# Patient Record
Sex: Male | Born: 1945 | Race: White | Hispanic: No | Smoking: Former smoker
Health system: Southern US, Community
[De-identification: ages and names within clinical notes are randomized; demographics above are authoritative.]

## PROBLEM LIST (undated history)

## (undated) DIAGNOSIS — I10 Essential (primary) hypertension: Secondary | ICD-10-CM

## (undated) DIAGNOSIS — C801 Malignant (primary) neoplasm, unspecified: Secondary | ICD-10-CM

## (undated) DIAGNOSIS — R51 Headache: Secondary | ICD-10-CM

## (undated) DIAGNOSIS — R519 Headache, unspecified: Secondary | ICD-10-CM

## (undated) DIAGNOSIS — K219 Gastro-esophageal reflux disease without esophagitis: Secondary | ICD-10-CM

## (undated) DIAGNOSIS — E039 Hypothyroidism, unspecified: Secondary | ICD-10-CM

## (undated) DIAGNOSIS — M1990 Unspecified osteoarthritis, unspecified site: Secondary | ICD-10-CM

---
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
## 2010-06-09 HISTORY — PX: EYE SURGERY: SHX253

## 2015-06-10 DIAGNOSIS — C801 Malignant (primary) neoplasm, unspecified: Secondary | ICD-10-CM

## 2015-06-10 HISTORY — DX: Malignant (primary) neoplasm, unspecified: C80.1

## 2017-12-24 NOTE — H&P (Signed)
TOTAL HIP ADMISSION H&P  Patient is admitted for right total hip arthroplasty, anterior approach.  Subjective:  Chief Complaint:   Right hip primary OA / pain  HPI: Calvin Yu, 72 y.o. male, has a history of pain and functional disability in the right hip due to arthritis and patient has failed non-surgical conservative treatments for greater than 12 weeks to include NSAID's and/or analgesics, use of assistive devices and activity modification.  Onset of symptoms was gradual starting 3+ years ago with gradually worsening course since that time.The patient noted no past surgery on the right hip(s).  Patient currently rates pain in the right hip at 9 out of 10 with activity. Patient has night pain, worsening of pain with activity and weight bearing, trendelenberg gait, pain that interfers with activities of daily living and pain with passive range of motion. Patient has evidence of periarticular osteophytes and joint space narrowing by imaging studies. This condition presents safety issues increasing the risk of falls.  There is no current active infection.  Risks, benefits and expectations were discussed with the patient.  Risks including but not limited to the risk of anesthesia, blood clots, nerve damage, blood vessel damage, failure of the prosthesis, infection and up to and including death.  Patient understand the risks, benefits and expectations and wishes to proceed with surgery.   PCP: Manfred Shirts, PA  D/C Plans:       Home   Post-op Meds:       No Rx given  Tranexamic Acid:      To be given - IV   Decadron:      Is to be given  FYI:      ASA  Norco  DME:   Pt already has equipment  PT:   No PT    Past Medical History:  Diagnosis Date  . Arthritis   . Cancer ([place]) 2017   Rectal  . GERD (gastroesophageal reflux disease)   . Headache   . Hypertension   . Hypothyroidism     Past Surgical History:  Procedure Laterality Date  . EYE SURGERY Bilateral 2012   Cataract   . THYROIDECTOMY  1963    No current facility-administered medications for this encounter.    Current Outpatient Medications  Medication Sig Dispense Refill Last Dose  . allopurinol (ZYLOPRIM) 300 MG tablet Take 300 mg by mouth daily.     . bimatoprost (LUMIGAN) 0.01 % SOLN Place 1 drop into both eyes at bedtime.     . brimonidine-timolol (COMBIGAN) 0.2-0.5 % ophthalmic solution Place 1 drop into both eyes every 12 (twelve) hours.     Marland Kitchen levothyroxine (SYNTHROID, LEVOTHROID) 175 MCG tablet Take 175 mcg by mouth daily before breakfast.     . MAGNESIUM PO Take 400 mg by mouth daily.     . pantoprazole (PROTONIX) 40 MG tablet Take 40 mg by mouth daily.     . potassium chloride SA (K-DUR,KLOR-CON) 20 MEQ tablet Take 20 mEq by mouth daily.     . tamsulosin (FLOMAX) 0.4 MG CAPS capsule Take 0.4 mg by mouth daily.      Allergies  Allergen Reactions  . Tomato Other (See Comments)    "extreme headaches"     Social History   Tobacco Use  . Smoking status: Former Smoker    Packs/day: 1.00    Years: 10.00    Pack years: 10.00    Types: Cigarettes    Last attempt to quit: 1972    Years  since quitting: 47.5  . Smokeless tobacco: Never Used  Substance Use Topics  . Alcohol use: Yes    Alcohol/week: 0.6 oz    Types: 1 Shots of liquor per week    Comment: daily       Review of Systems  Constitutional: Negative.   HENT: Negative.   Eyes: Negative.   Respiratory: Negative.   Cardiovascular: Negative.   Gastrointestinal: Positive for heartburn.       Colostomy  Genitourinary: Negative.   Musculoskeletal: Positive for joint pain.  Skin: Negative.   Neurological: Positive for headaches.  Endo/Heme/Allergies: Negative.   Psychiatric/Behavioral: Negative.     Objective:  Physical Exam  Constitutional: He is oriented to person, place, and time. He appears well-developed.  HENT:  Head: Normocephalic.  Eyes: Pupils are equal, round, and reactive to light.  Neck: Neck supple. No  JVD present. No tracheal deviation present. No thyromegaly present.  Cardiovascular: Normal rate, regular rhythm and intact distal pulses.  Respiratory: Effort normal and breath sounds normal. No respiratory distress. He has no wheezes.  GI: Soft. There is no tenderness. There is no guarding.  Musculoskeletal:       Right hip: He exhibits decreased range of motion, decreased strength, tenderness and bony tenderness. He exhibits no swelling, no deformity and no laceration.  Lymphadenopathy:    He has no cervical adenopathy.  Neurological: He is alert and oriented to person, place, and time.  Skin: Skin is warm and dry.  Psychiatric: He has a normal mood and affect.     Imaging Review Plain radiographs demonstrate severe degenerative joint disease of the right hip(s). The bone quality appears to be good for age and reported activity level.    Preoperative templating of the joint replacement has been completed, documented, and submitted to the Operating Room personnel in order to optimize intra-operative equipment management.     Assessment/Plan:  End stage arthritis, right hip  The patient history, physical examination, clinical judgement of the provider and imaging studies are consistent with end stage degenerative joint disease of the right hip and total hip arthroplasty is deemed medically necessary. The treatment options including medical management, injection therapy, arthroscopy and arthroplasty were discussed at length. The risks and benefits of total hip arthroplasty were presented and reviewed. The risks due to aseptic loosening, infection, stiffness, dislocation/subluxation,  thromboembolic complications and other imponderables were discussed.  The patient acknowledged the explanation, agreed to proceed with the plan and consent was signed. Patient is being admitted for inpatient treatment for surgery, pain control, PT, OT, prophylactic antibiotics, VTE prophylaxis, progressive  ambulation and ADL's and discharge planning.The patient is planning to be discharged home.     [place] Pugh Brocha Gilliam   PA-C  01/01/2018, 8:16 AM

## 2017-12-30 NOTE — Patient Instructions (Signed)
Calvin Yu  12/30/2017   Your procedure is scheduled on: 01-05-18   Report to [facility] Main  Entrance    Report to Admitting at 5:30 AM    Call this number if you have problems the morning of surgery [phone]   Remember: Do not eat food or drink liquids :After Midnight.     Take these medicines the morning of surgery with A SIP OF WATER:Allopurinol (Zyloprim), Levothyroxine (Snythroid), and Pantoprazole (Protonix) . You may bring and use your inhaler as needed                                You may not have any metal on your body including hair pins and              piercings  Do not wear jewelry, lotions, powders , cologne or deodorant             Men may shave face and neck.   Do not bring valuables to the hospital. [place].  Contacts, dentures or bridgework may not be worn into surgery.  Leave suitcase in the car. After surgery it may be brought to your room.    Special Instructions: N/A              Please read over the following fact sheets you were given: _____________________________________________________________________             [facility] - Preparing for Surgery Before surgery, you can play an important role.  Because skin is not sterile, your skin needs to be as free of germs as possible.  You can reduce the number of germs on your skin by washing with CHG (chlorahexidine gluconate) soap before surgery.  CHG is an antiseptic cleaner which kills germs and bonds with the skin to continue killing germs even after washing. Please DO NOT use if you have an allergy to CHG or antibacterial soaps.  If your skin becomes reddened/irritated stop using the CHG and inform your nurse when you arrive at Short Stay. Do not shave (including legs and underarms) for at least 48 hours prior to the first CHG shower.  You may shave your face/neck. Please follow these instructions carefully:  1.  Shower  with CHG Soap the night before surgery and the  morning of Surgery.  2.  If you choose to wash your hair, wash your hair first as usual with your  normal  shampoo.  3.  After you shampoo, rinse your hair and body thoroughly to remove the  shampoo.                           4.  Use CHG as you would any other liquid soap.  You can apply chg directly  to the skin and wash                       Gently with a scrungie or clean washcloth.  5.  Apply the CHG Soap to your body ONLY FROM THE NECK DOWN.   Do not use on face/ open  Wound or open sores. Avoid contact with eyes, ears mouth and genitals (private parts).                       Wash face,  Genitals (private parts) with your normal soap.             6.  Wash thoroughly, paying special attention to the area where your surgery  will be performed.  7.  Thoroughly rinse your body with warm water from the neck down.  8.  DO NOT shower/wash with your normal soap after using and rinsing off  the CHG Soap.                9.  Pat yourself dry with a clean towel.            10.  Wear clean pajamas.            11.  Place clean sheets on your bed the night of your first shower and do not  sleep with pets. Day of Surgery : Do not apply any lotions/deodorants the morning of surgery.  Please wear clean clothes to the hospital/surgery center.  FAILURE TO FOLLOW THESE INSTRUCTIONS MAY RESULT IN THE CANCELLATION OF YOUR SURGERY PATIENT SIGNATURE_________________________________  NURSE SIGNATURE__________________________________  ________________________________________________________________________   Calvin Yu  An incentive spirometer is a tool that can help keep your lungs clear and active. This tool measures how well you are filling your lungs with each breath. Taking long deep breaths may help reverse or decrease the chance of developing breathing (pulmonary) problems (especially infection) following:  A long period  of time when you are unable to move or be active. BEFORE THE PROCEDURE   If the spirometer includes an indicator to show your best effort, your nurse or respiratory therapist will set it to a desired goal.  If possible, sit up straight or lean slightly forward. Try not to slouch.  Hold the incentive spirometer in an upright position. INSTRUCTIONS FOR USE  1. Sit on the edge of your bed if possible, or sit up as far as you can in bed or on a chair. 2. Hold the incentive spirometer in an upright position. 3. Breathe out normally. 4. Place the mouthpiece in your mouth and seal your lips tightly around it. 5. Breathe in slowly and as deeply as possible, raising the piston or the ball toward the top of the column. 6. Hold your breath for 3-5 seconds or for as long as possible. Allow the piston or ball to fall to the bottom of the column. 7. Remove the mouthpiece from your mouth and breathe out normally. 8. Rest for a few seconds and repeat Steps 1 through 7 at least 10 times every 1-2 hours when you are awake. Take your time and take a few normal breaths between deep breaths. 9. The spirometer may include an indicator to show your best effort. Use the indicator as a goal to work toward during each repetition. 10. After each set of 10 deep breaths, practice coughing to be sure your lungs are clear. If you have an incision (the cut made at the time of surgery), support your incision when coughing by placing a pillow or rolled up towels firmly against it. Once you are able to get out of bed, walk around indoors and cough well. You may stop using the incentive spirometer when instructed by your caregiver.  RISKS AND COMPLICATIONS  Take your time so you do not get  dizzy or light-headed.  If you are in pain, you may need to take or ask for pain medication before doing incentive spirometry. It is harder to take a deep breath if you are having pain. AFTER USE  Rest and breathe slowly and easily.  It  can be helpful to keep track of a log of your progress. Your caregiver can provide you with a simple table to help with this. If you are using the spirometer at home, follow these instructions: [name] IF:   You are having difficultly using the spirometer.  You have trouble using the spirometer as often as instructed.  Your pain medication is not giving enough relief while using the spirometer.  You develop fever of 100.5 F (38.1 C) or higher. SEEK IMMEDIATE MEDICAL CARE IF:   You cough up bloody sputum that had not been present before.  You develop fever of 102 F (38.9 C) or greater.  You develop worsening pain at or near the incision site. MAKE SURE YOU:   Understand these instructions.  Will watch your condition.  Will get help right away if you are not doing well or get worse. Document Released: 10/06/2006 Document Revised: 08/18/2011 Document Reviewed: 12/07/2006 Exit[employer] Patient Information 2014 [employer], [place].   ________________________________________________________________________  WHAT IS A BLOOD TRANSFUSION? Blood Transfusion Information  A transfusion is the replacement of blood or some of its parts. Blood is made up of multiple cells which provide different functions.  Red blood cells carry oxygen and are used for blood loss replacement.  White blood cells fight against infection.  Platelets control bleeding.  Plasma helps clot blood.  Other blood products are available for specialized needs, such as hemophilia or other clotting disorders. BEFORE THE TRANSFUSION  Who gives blood for transfusions?   Healthy volunteers who are fully evaluated to make sure their blood is safe. This is blood bank blood. Transfusion therapy is the safest it has ever been in the practice of medicine. Before blood is taken from a donor, a complete history is taken to make sure that person has no history of diseases nor engages in risky social behavior (examples  are intravenous drug use or sexual activity with multiple partners). The donor's travel history is screened to minimize risk of transmitting infections, such as malaria. The donated blood is tested for signs of infectious diseases, such as HIV and hepatitis. The blood is then tested to be sure it is compatible with you in order to minimize the chance of a transfusion reaction. If you or a relative donates blood, this is often done in anticipation of surgery and is not appropriate for emergency situations. It takes many days to process the donated blood. RISKS AND COMPLICATIONS Although transfusion therapy is very safe and saves many lives, the main dangers of transfusion include:   Getting an infectious disease.  Developing a transfusion reaction. This is an allergic reaction to something in the blood you were given. Every precaution is taken to prevent this. The decision to have a blood transfusion has been considered carefully by your caregiver before blood is given. Blood is not given unless the benefits outweigh the risks. AFTER THE TRANSFUSION  Right after receiving a blood transfusion, you will usually feel much better and more energetic. This is especially true if your red blood cells have gotten low (anemic). The transfusion raises the level of the red blood cells which carry oxygen, and this usually causes an energy increase.  The nurse administering the transfusion will  monitor you carefully for complications. HOME CARE INSTRUCTIONS  No special instructions are needed after a transfusion. You may find your energy is better. Speak with your caregiver about any limitations on activity for underlying diseases you may have. SEEK MEDICAL CARE IF:   Your condition is not improving after your transfusion.  You develop redness or irritation at the intravenous (IV) site. SEEK IMMEDIATE MEDICAL CARE IF:  Any of the following symptoms occur over the next 12 hours:  Shaking chills.  You have a  temperature by mouth above 102 F (38.9 C), not controlled by medicine.  Chest, back, or muscle pain.  People around you feel you are not acting correctly or are confused.  Shortness of breath or difficulty breathing.  Dizziness and fainting.  You get a rash or develop hives.  You have a decrease in urine output.  Your urine turns a dark color or changes to pink, red, or brown. Any of the following symptoms occur over the next 10 days:  You have a temperature by mouth above 102 F (38.9 C), not controlled by medicine.  Shortness of breath.  Weakness after normal activity.  The white part of the eye turns yellow (jaundice).  You have a decrease in the amount of urine or are urinating less often.  Your urine turns a dark color or changes to pink, red, or brown. Document Released: 05/23/2000 Document Revised: 08/18/2011 Document Reviewed: 01/10/2008 [facility] Patient Information 2014 [place], [place].  _______________________________________________________________________

## 2017-12-31 ENCOUNTER — Encounter (HOSPITAL_COMMUNITY)
Admission: RE | Admit: 2017-12-31 | Discharge: 2017-12-31 | Disposition: A | Discharge status: Home / Self Care | Payer: Medicare Other | Source: Ambulatory Visit | Attending: Orthopedic Surgery | Admitting: Orthopedic Surgery

## 2017-12-31 ENCOUNTER — Other Ambulatory Visit: Payer: Self-pay

## 2017-12-31 ENCOUNTER — Encounter (HOSPITAL_COMMUNITY): Payer: Self-pay

## 2017-12-31 ENCOUNTER — Other Ambulatory Visit: Payer: Self-pay | Admitting: Orthopedic Surgery

## 2017-12-31 DIAGNOSIS — Z0183 Encounter for blood typing: Secondary | ICD-10-CM | POA: Diagnosis not present

## 2017-12-31 DIAGNOSIS — Z01818 Encounter for other preprocedural examination: Secondary | ICD-10-CM | POA: Diagnosis not present

## 2017-12-31 DIAGNOSIS — R9431 Abnormal electrocardiogram [ECG] [EKG]: Secondary | ICD-10-CM | POA: Insufficient documentation

## 2017-12-31 DIAGNOSIS — M1611 Unilateral primary osteoarthritis, right hip: Secondary | ICD-10-CM | POA: Diagnosis not present

## 2017-12-31 DIAGNOSIS — Z01812 Encounter for preprocedural laboratory examination: Secondary | ICD-10-CM | POA: Diagnosis not present

## 2017-12-31 DIAGNOSIS — I10 Essential (primary) hypertension: Secondary | ICD-10-CM | POA: Insufficient documentation

## 2017-12-31 HISTORY — DX: Malignant (primary) neoplasm, unspecified: C80.1

## 2017-12-31 HISTORY — DX: Unspecified osteoarthritis, unspecified site: M19.90

## 2017-12-31 HISTORY — DX: Essential (primary) hypertension: I10

## 2017-12-31 HISTORY — DX: Hypothyroidism, unspecified: E03.9

## 2017-12-31 HISTORY — DX: Headache, unspecified: R51.9

## 2017-12-31 HISTORY — DX: Headache: R51

## 2017-12-31 HISTORY — DX: Gastro-esophageal reflux disease without esophagitis: K21.9

## 2017-12-31 LAB — BASIC METABOLIC PANEL
ANION GAP: 13 (ref 5–15)
BUN: 11 mg/dL (ref 8–23)
CALCIUM: 9.3 mg/dL (ref 8.9–10.3)
CO2: 29 mmol/L (ref 22–32)
Chloride: 103 mmol/L (ref 98–111)
Creatinine, Ser: 0.63 mg/dL (ref 0.61–1.24)
GFR calc Af Amer: >60 mL/min (ref >60)
GLUCOSE: 89 mg/dL (ref 70–99)
Potassium: 3.3 mmol/L — ABNORMAL LOW (ref 3.5–5.1)
Sodium: 145 mmol/L (ref 135–145)

## 2017-12-31 LAB — CBC
HEMATOCRIT: 50.6 % (ref 39.0–52.0)
HEMOGLOBIN: 17.3 g/dL — AB (ref 13.0–17.0)
MCH: 35.2 pg — AB (ref 26.0–34.0)
MCHC: 34.2 g/dL (ref 30.0–36.0)
MCV: 102.8 fL — ABNORMAL HIGH (ref 78.0–100.0)
Platelets: 151 10*3/uL (ref 150–400)
RBC: 4.92 MIL/uL (ref 4.22–5.81)
RDW: 15 % (ref 11.5–15.5)
WBC: 7.4 10*3/uL (ref 4.0–10.5)

## 2017-12-31 LAB — SURGICAL PCR SCREEN
MRSA, PCR: NEGATIVE
STAPHYLOCOCCUS AUREUS: POSITIVE — AB

## 2017-12-31 LAB — ABO/RH: ABO/RH(D): O POS

## 2017-12-31 NOTE — Care Plan (Signed)
Ortho Bundle R THA scheduled on 01-05-18 DCP:  Home with brother.  1 story home with 2 ste.   DME:  No needs.   Borrowing a RW and toilets are elevated. PT:  HEP

## 2018-01-04 NOTE — Anesthesia Preprocedure Evaluation (Addendum)
Anesthesia Evaluation  Patient identified by MRN, date of birth, ID band Patient awake    Reviewed: Allergy & Precautions, H&P , NPO status , Patient's Chart, lab work & pertinent test results  Airway Mallampati: III  TM Distance: >3 FB Neck ROM: Full    Dental no notable dental hx. (+) Teeth Intact, Dental Advisory Given   Pulmonary neg pulmonary ROS, former smoker,    Pulmonary exam normal breath sounds clear to auscultation       Cardiovascular Exercise Tolerance: Good hypertension,  Rhythm:Regular Rate:Normal     Neuro/Psych  Headaches, negative psych ROS   GI/Hepatic Neg liver ROS, GERD  Medicated and Controlled,  Endo/Other  Hypothyroidism   Renal/GU negative Renal ROS  negative genitourinary   Musculoskeletal  (+) Arthritis , Osteoarthritis,    Abdominal   Peds  Hematology negative hematology ROS (+)   Anesthesia Other Findings   Reproductive/Obstetrics negative OB ROS                            Anesthesia Physical Anesthesia Plan  ASA: II  Anesthesia Plan: Spinal   Post-op Pain Management:    Induction: Intravenous  PONV Risk Score and Plan: 2 and Propofol infusion and Ondansetron  Airway Management Planned: Simple Face Mask  Additional Equipment:   Intra-op Plan:   Post-operative Plan:   Informed Consent: I have reviewed the patients History and Physical, chart, labs and discussed the procedure including the risks, benefits and alternatives for the proposed anesthesia with the patient or authorized representative who has indicated his/her understanding and acceptance.   Dental advisory given  Plan Discussed with: CRNA  Anesthesia Plan Comments:         Anesthesia Quick Evaluation

## 2018-01-05 ENCOUNTER — Encounter (HOSPITAL_COMMUNITY): Payer: Self-pay | Admitting: Emergency Medicine

## 2018-01-05 ENCOUNTER — Inpatient Hospital Stay (HOSPITAL_COMMUNITY): Payer: Medicare Other | Admitting: Anesthesiology

## 2018-01-05 ENCOUNTER — Inpatient Hospital Stay (HOSPITAL_COMMUNITY): Payer: Medicare Other

## 2018-01-05 ENCOUNTER — Encounter (HOSPITAL_COMMUNITY)
Admission: RE | Disposition: A | Discharge status: Home / Self Care | Payer: Self-pay | Source: Ambulatory Visit | Attending: Orthopedic Surgery

## 2018-01-05 ENCOUNTER — Other Ambulatory Visit: Payer: Self-pay

## 2018-01-05 ENCOUNTER — Inpatient Hospital Stay (HOSPITAL_COMMUNITY)
Admission: RE | Admit: 2018-01-05 | Discharge: 2018-01-06 | DRG: 470 | Disposition: A | Discharge status: Home / Self Care | Payer: Medicare Other | Attending: Orthopedic Surgery | Admitting: Orthopedic Surgery

## 2018-01-05 DIAGNOSIS — Z79890 Hormone replacement therapy (postmenopausal): Secondary | ICD-10-CM

## 2018-01-05 DIAGNOSIS — E890 Postprocedural hypothyroidism: Secondary | ICD-10-CM | POA: Diagnosis present

## 2018-01-05 DIAGNOSIS — Z96641 Presence of right artificial hip joint: Secondary | ICD-10-CM

## 2018-01-05 DIAGNOSIS — Z87891 Personal history of nicotine dependence: Secondary | ICD-10-CM | POA: Diagnosis not present

## 2018-01-05 DIAGNOSIS — E876 Hypokalemia: Secondary | ICD-10-CM

## 2018-01-05 DIAGNOSIS — Z96649 Presence of unspecified artificial hip joint: Secondary | ICD-10-CM

## 2018-01-05 DIAGNOSIS — M1611 Unilateral primary osteoarthritis, right hip: Principal | ICD-10-CM | POA: Diagnosis present

## 2018-01-05 DIAGNOSIS — K219 Gastro-esophageal reflux disease without esophagitis: Secondary | ICD-10-CM | POA: Diagnosis present

## 2018-01-05 DIAGNOSIS — I10 Essential (primary) hypertension: Secondary | ICD-10-CM | POA: Diagnosis present

## 2018-01-05 DIAGNOSIS — E663 Overweight: Secondary | ICD-10-CM | POA: Diagnosis present

## 2018-01-05 HISTORY — PX: TOTAL HIP ARTHROPLASTY: SHX124

## 2018-01-05 LAB — TYPE AND SCREEN
ABO/RH(D): O POS
ANTIBODY SCREEN: NEGATIVE

## 2018-01-05 SURGERY — ARTHROPLASTY, HIP, TOTAL, ANTERIOR APPROACH
Anesthesia: Spinal | Site: Hip | Laterality: Right

## 2018-01-05 MED ORDER — FENTANYL CITRATE (PF) 100 MCG/2ML IJ SOLN
INTRAMUSCULAR | Status: DC | PRN
  Administered 2018-01-05: 100 ug via INTRAVENOUS

## 2018-01-05 MED ORDER — MIDAZOLAM HCL 5 MG/5ML IJ SOLN
INTRAMUSCULAR | Status: DC | PRN
  Administered 2018-01-05: 2 mg via INTRAVENOUS

## 2018-01-05 MED ORDER — ALUM & MAG HYDROXIDE-SIMETH 200-200-20 MG/5ML PO SUSP: 15.0000 mL | ORAL | Status: DC | PRN

## 2018-01-05 MED ORDER — POLYETHYLENE GLYCOL 3350 17 G PO PACK: 17.0000 g | PACK | Freq: Two times a day (BID) | ORAL | 0 refills | Status: AC

## 2018-01-05 MED ORDER — METHOCARBAMOL 500 MG PO TABS
500.0000 mg | ORAL_TABLET | Freq: Four times a day (QID) | ORAL | Status: DC | PRN
  Administered 2018-01-05: 500 mg via ORAL
  Filled 2018-01-05: qty 1

## 2018-01-05 MED ORDER — CHLORHEXIDINE GLUCONATE 4 % EX LIQD: 60.0000 mL | Freq: Once | CUTANEOUS | Status: DC

## 2018-01-05 MED ORDER — PHENYLEPHRINE 40 MCG/ML (10ML) SYRINGE FOR IV PUSH (FOR BLOOD PRESSURE SUPPORT)
PREFILLED_SYRINGE | INTRAVENOUS | Status: DC | PRN
  Administered 2018-01-05: 40 ug via INTRAVENOUS

## 2018-01-05 MED ORDER — CHLORHEXIDINE GLUCONATE CLOTH 2 % EX PADS
6.0000 | MEDICATED_PAD | Freq: Every day | CUTANEOUS | Status: DC
  Administered 2018-01-06: 6 via TOPICAL

## 2018-01-05 MED ORDER — TRANEXAMIC ACID 1000 MG/10ML IV SOLN
1000.0000 mg | INTRAVENOUS | Status: AC
  Administered 2018-01-05: 1000 mg via INTRAVENOUS
  Filled 2018-01-05: qty 10

## 2018-01-05 MED ORDER — METOCLOPRAMIDE HCL 5 MG/ML IJ SOLN: 5.0000 mg | Freq: Three times a day (TID) | INTRAMUSCULAR | Status: DC | PRN

## 2018-01-05 MED ORDER — LIDOCAINE 2% (20 MG/ML) 5 ML SYRINGE
INTRAMUSCULAR | Status: AC
  Filled 2018-01-05: qty 5

## 2018-01-05 MED ORDER — LACTATED RINGERS IV SOLN
INTRAVENOUS | Status: DC | PRN
  Administered 2018-01-05 (×2): via INTRAVENOUS

## 2018-01-05 MED ORDER — EPHEDRINE SULFATE-NACL 50-0.9 MG/10ML-% IV SOSY
PREFILLED_SYRINGE | INTRAVENOUS | Status: DC | PRN
  Administered 2018-01-05 (×5): 10 mg via INTRAVENOUS

## 2018-01-05 MED ORDER — MIDAZOLAM HCL 2 MG/2ML IJ SOLN
INTRAMUSCULAR | Status: AC
  Filled 2018-01-05: qty 2

## 2018-01-05 MED ORDER — PROPOFOL 10 MG/ML IV BOLUS
INTRAVENOUS | Status: DC | PRN
  Administered 2018-01-05: 30 mg via INTRAVENOUS

## 2018-01-05 MED ORDER — 0.9 % SODIUM CHLORIDE (POUR BTL) OPTIME
TOPICAL | Status: DC | PRN
  Administered 2018-01-05: 1000 mL

## 2018-01-05 MED ORDER — DEXAMETHASONE SODIUM PHOSPHATE 10 MG/ML IJ SOLN
10.0000 mg | Freq: Once | INTRAMUSCULAR | Status: AC
  Administered 2018-01-06: 10 mg via INTRAVENOUS
  Filled 2018-01-05: qty 1

## 2018-01-05 MED ORDER — MORPHINE SULFATE (PF) 2 MG/ML IV SOLN: 0.5000 mg | INTRAVENOUS | Status: DC | PRN

## 2018-01-05 MED ORDER — POLYETHYLENE GLYCOL 3350 17 G PO PACK
17.0000 g | PACK | Freq: Two times a day (BID) | ORAL | Status: DC
  Administered 2018-01-05 – 2018-01-06 (×2): 17 g via ORAL
  Filled 2018-01-05 (×2): qty 1

## 2018-01-05 MED ORDER — LATANOPROST 0.005 % OP SOLN
1.0000 [drp] | Freq: Every day | OPHTHALMIC | Status: DC
  Administered 2018-01-05: 1 [drp] via OPHTHALMIC
  Filled 2018-01-05: qty 2.5

## 2018-01-05 MED ORDER — BUPIVACAINE IN DEXTROSE 0.75-8.25 % IT SOLN
INTRATHECAL | Status: DC | PRN
  Administered 2018-01-05: 2 mL via INTRATHECAL

## 2018-01-05 MED ORDER — DIPHENHYDRAMINE HCL 12.5 MG/5ML PO ELIX: 12.5000 mg | ORAL_SOLUTION | ORAL | Status: DC | PRN

## 2018-01-05 MED ORDER — METHOCARBAMOL 1000 MG/10ML IJ SOLN
500.0000 mg | Freq: Four times a day (QID) | INTRAVENOUS | Status: DC | PRN
  Administered 2018-01-05: 500 mg via INTRAVENOUS
  Filled 2018-01-05: qty 550

## 2018-01-05 MED ORDER — DOCUSATE SODIUM 100 MG PO CAPS: 100.0000 mg | ORAL_CAPSULE | Freq: Two times a day (BID) | ORAL | 0 refills | Status: AC

## 2018-01-05 MED ORDER — PANTOPRAZOLE SODIUM 40 MG PO TBEC
40.0000 mg | DELAYED_RELEASE_TABLET | Freq: Every day | ORAL | Status: DC
  Administered 2018-01-06: 40 mg via ORAL
  Filled 2018-01-05: qty 1

## 2018-01-05 MED ORDER — ONDANSETRON HCL 4 MG/2ML IJ SOLN: 4.0000 mg | Freq: Four times a day (QID) | INTRAMUSCULAR | Status: DC | PRN

## 2018-01-05 MED ORDER — TIMOLOL MALEATE 0.5 % OP SOLN
1.0000 [drp] | Freq: Two times a day (BID) | OPHTHALMIC | Status: DC
  Administered 2018-01-05 – 2018-01-06 (×2): 1 [drp] via OPHTHALMIC
  Filled 2018-01-05: qty 5

## 2018-01-05 MED ORDER — BRIMONIDINE TARTRATE 0.2 % OP SOLN
1.0000 [drp] | Freq: Two times a day (BID) | OPHTHALMIC | Status: DC
  Administered 2018-01-05 – 2018-01-06 (×2): 1 [drp] via OPHTHALMIC
  Filled 2018-01-05: qty 5

## 2018-01-05 MED ORDER — FENTANYL CITRATE (PF) 100 MCG/2ML IJ SOLN
INTRAMUSCULAR | Status: AC
  Filled 2018-01-05: qty 2

## 2018-01-05 MED ORDER — LIDOCAINE HCL (CARDIAC) PF 100 MG/5ML IV SOSY
PREFILLED_SYRINGE | INTRAVENOUS | Status: DC | PRN
  Administered 2018-01-05: 60 mg via INTRAVENOUS

## 2018-01-05 MED ORDER — ACETAMINOPHEN 325 MG PO TABS: 325.0000 mg | ORAL_TABLET | Freq: Four times a day (QID) | ORAL | Status: DC | PRN

## 2018-01-05 MED ORDER — FERROUS SULFATE 325 (65 FE) MG PO TABS
325.0000 mg | ORAL_TABLET | Freq: Three times a day (TID) | ORAL | Status: DC
  Administered 2018-01-06 (×2): 325 mg via ORAL
  Filled 2018-01-05 (×2): qty 1

## 2018-01-05 MED ORDER — PROPOFOL 10 MG/ML IV BOLUS
INTRAVENOUS | Status: AC
  Filled 2018-01-05: qty 60

## 2018-01-05 MED ORDER — PROPOFOL 500 MG/50ML IV EMUL
INTRAVENOUS | Status: DC | PRN
  Administered 2018-01-05: 100 ug/kg/min via INTRAVENOUS

## 2018-01-05 MED ORDER — PHENOL 1.4 % MT LIQD
1.0000 | OROMUCOSAL | Status: DC | PRN
  Filled 2018-01-05: qty 177

## 2018-01-05 MED ORDER — CEFAZOLIN SODIUM-DEXTROSE 2-4 GM/100ML-% IV SOLN
2.0000 g | Freq: Four times a day (QID) | INTRAVENOUS | Status: AC
  Administered 2018-01-05 (×2): 2 g via INTRAVENOUS
  Filled 2018-01-05 (×2): qty 100

## 2018-01-05 MED ORDER — BISACODYL 10 MG RE SUPP: 10.0000 mg | Freq: Every day | RECTAL | Status: DC | PRN

## 2018-01-05 MED ORDER — HYDROCODONE-ACETAMINOPHEN 7.5-325 MG PO TABS: 1.0000 | ORAL_TABLET | ORAL | 0 refills | Status: AC | PRN

## 2018-01-05 MED ORDER — EPHEDRINE 5 MG/ML INJ
INTRAVENOUS | Status: AC
  Filled 2018-01-05: qty 10

## 2018-01-05 MED ORDER — FERROUS SULFATE 325 (65 FE) MG PO TABS: 325.0000 mg | ORAL_TABLET | Freq: Three times a day (TID) | ORAL | 3 refills | Status: AC

## 2018-01-05 MED ORDER — TAMSULOSIN HCL 0.4 MG PO CAPS
0.4000 mg | ORAL_CAPSULE | Freq: Every day | ORAL | Status: DC
  Administered 2018-01-05 – 2018-01-06 (×2): 0.4 mg via ORAL
  Filled 2018-01-05 (×2): qty 1

## 2018-01-05 MED ORDER — CELECOXIB 200 MG PO CAPS
200.0000 mg | ORAL_CAPSULE | Freq: Two times a day (BID) | ORAL | Status: DC
  Administered 2018-01-05 – 2018-01-06 (×2): 200 mg via ORAL
  Filled 2018-01-05 (×2): qty 1

## 2018-01-05 MED ORDER — METOCLOPRAMIDE HCL 5 MG PO TABS: 5.0000 mg | ORAL_TABLET | Freq: Three times a day (TID) | ORAL | Status: DC | PRN

## 2018-01-05 MED ORDER — DEXAMETHASONE SODIUM PHOSPHATE 10 MG/ML IJ SOLN
10.0000 mg | Freq: Once | INTRAMUSCULAR | Status: AC
  Administered 2018-01-05: 10 mg via INTRAVENOUS

## 2018-01-05 MED ORDER — METHOCARBAMOL 500 MG IVPB - SIMPLE MED
INTRAVENOUS | Status: AC
  Filled 2018-01-05: qty 50

## 2018-01-05 MED ORDER — LEVOTHYROXINE SODIUM 50 MCG PO TABS
175.0000 ug | ORAL_TABLET | Freq: Every day | ORAL | Status: DC
  Administered 2018-01-06: 175 ug via ORAL
  Filled 2018-01-05: qty 1

## 2018-01-05 MED ORDER — HYDROCODONE-ACETAMINOPHEN 5-325 MG PO TABS
1.0000 | ORAL_TABLET | ORAL | Status: DC | PRN
  Administered 2018-01-05 – 2018-01-06 (×2): 2 via ORAL
  Administered 2018-01-06: 1 via ORAL
  Administered 2018-01-06: 2 via ORAL
  Filled 2018-01-05 (×4): qty 2

## 2018-01-05 MED ORDER — DEXAMETHASONE SODIUM PHOSPHATE 10 MG/ML IJ SOLN
INTRAMUSCULAR | Status: AC
  Filled 2018-01-05: qty 1

## 2018-01-05 MED ORDER — ONDANSETRON HCL 4 MG PO TABS: 4.0000 mg | ORAL_TABLET | Freq: Four times a day (QID) | ORAL | Status: DC | PRN

## 2018-01-05 MED ORDER — TRANEXAMIC ACID 1000 MG/10ML IV SOLN
1000.0000 mg | Freq: Once | INTRAVENOUS | Status: AC
  Administered 2018-01-05: 1000 mg via INTRAVENOUS
  Filled 2018-01-05: qty 10

## 2018-01-05 MED ORDER — ASPIRIN 81 MG PO CHEW
81.0000 mg | CHEWABLE_TABLET | Freq: Two times a day (BID) | ORAL | Status: DC
  Administered 2018-01-05 – 2018-01-06 (×2): 81 mg via ORAL
  Filled 2018-01-05 (×2): qty 1

## 2018-01-05 MED ORDER — MUPIROCIN 2 % EX OINT
1.0000 "application " | TOPICAL_OINTMENT | Freq: Two times a day (BID) | CUTANEOUS | Status: DC
  Administered 2018-01-05 – 2018-01-06 (×2): 1 via NASAL
  Filled 2018-01-05: qty 22

## 2018-01-05 MED ORDER — HYDROCODONE-ACETAMINOPHEN 7.5-325 MG PO TABS
1.0000 | ORAL_TABLET | ORAL | Status: DC | PRN
  Administered 2018-01-05: 1 via ORAL
  Filled 2018-01-05: qty 1

## 2018-01-05 MED ORDER — CEFAZOLIN SODIUM-DEXTROSE 2-4 GM/100ML-% IV SOLN
2.0000 g | INTRAVENOUS | Status: AC
  Administered 2018-01-05: 2 g via INTRAVENOUS
  Filled 2018-01-05: qty 100

## 2018-01-05 MED ORDER — ONDANSETRON HCL 4 MG/2ML IJ SOLN
INTRAMUSCULAR | Status: AC
  Filled 2018-01-05: qty 2

## 2018-01-05 MED ORDER — MENTHOL 3 MG MT LOZG: 1.0000 | LOZENGE | OROMUCOSAL | Status: DC | PRN

## 2018-01-05 MED ORDER — ASPIRIN 81 MG PO CHEW: 81.0000 mg | CHEWABLE_TABLET | Freq: Two times a day (BID) | ORAL | 0 refills | Status: AC

## 2018-01-05 MED ORDER — MAGNESIUM CITRATE PO SOLN: 1.0000 | Freq: Once | ORAL | Status: DC | PRN

## 2018-01-05 MED ORDER — STERILE WATER FOR IRRIGATION IR SOLN
Status: DC | PRN
  Administered 2018-01-05: 2000 mL

## 2018-01-05 MED ORDER — DOCUSATE SODIUM 100 MG PO CAPS
100.0000 mg | ORAL_CAPSULE | Freq: Two times a day (BID) | ORAL | Status: DC
  Administered 2018-01-05 – 2018-01-06 (×2): 100 mg via ORAL
  Filled 2018-01-05 (×2): qty 1

## 2018-01-05 MED ORDER — BRIMONIDINE TARTRATE-TIMOLOL 0.2-0.5 % OP SOLN
1.0000 [drp] | Freq: Two times a day (BID) | OPHTHALMIC | Status: DC
  Filled 2018-01-05: qty 5

## 2018-01-05 MED ORDER — ONDANSETRON HCL 4 MG/2ML IJ SOLN
INTRAMUSCULAR | Status: DC | PRN
  Administered 2018-01-05: 4 mg via INTRAVENOUS

## 2018-01-05 MED ORDER — METHOCARBAMOL 500 MG PO TABS: 500.0000 mg | ORAL_TABLET | Freq: Four times a day (QID) | ORAL | 0 refills | Status: AC | PRN

## 2018-01-05 MED ORDER — SODIUM CHLORIDE 0.9 % IV SOLN
INTRAVENOUS | Status: DC
  Administered 2018-01-05 (×2): via INTRAVENOUS

## 2018-01-05 MED ORDER — ALLOPURINOL 300 MG PO TABS
300.0000 mg | ORAL_TABLET | Freq: Every day | ORAL | Status: DC
  Administered 2018-01-06: 300 mg via ORAL
  Filled 2018-01-05: qty 1

## 2018-01-05 SURGICAL SUPPLY — 35 items
BAG DECANTER FOR FLEXI CONT (MISCELLANEOUS) IMPLANT
BAG ZIPLOCK 12X15 (MISCELLANEOUS) IMPLANT
BLADE SAG 18X100X1.27 (BLADE) ×3 IMPLANT
CAPT HIP TOTAL 2 ×3 IMPLANT
COVER PERINEAL POST (MISCELLANEOUS) ×3 IMPLANT
COVER SURGICAL LIGHT HANDLE (MISCELLANEOUS) ×3 IMPLANT
DERMABOND ADVANCED (GAUZE/BANDAGES/DRESSINGS) ×2
DERMABOND ADVANCED .7 DNX12 (GAUZE/BANDAGES/DRESSINGS) ×1 IMPLANT
DRAPE STERI IOBAN 125X83 (DRAPES) ×3 IMPLANT
DRAPE U-SHAPE 47X51 STRL (DRAPES) ×6 IMPLANT
DRESSING AQUACEL AG SP 3.5X10 (GAUZE/BANDAGES/DRESSINGS) ×1 IMPLANT
DRSG AQUACEL AG SP 3.5X10 (GAUZE/BANDAGES/DRESSINGS) ×3
DURAPREP 26ML APPLICATOR (WOUND CARE) ×3 IMPLANT
ELECT REM PT RETURN 15FT ADLT (MISCELLANEOUS) ×3 IMPLANT
GLOVE BIOGEL M STRL SZ7.5 (GLOVE) IMPLANT
GLOVE BIOGEL PI IND STRL 7.5 (GLOVE) ×1 IMPLANT
GLOVE BIOGEL PI IND STRL 8.5 (GLOVE) ×1 IMPLANT
GLOVE BIOGEL PI INDICATOR 7.5 (GLOVE) ×2
GLOVE BIOGEL PI INDICATOR 8.5 (GLOVE) ×2
GLOVE ECLIPSE 8.0 STRL XLNG CF (GLOVE) ×6 IMPLANT
GLOVE ORTHO TXT STRL SZ7.5 (GLOVE) ×3 IMPLANT
GLOVE SURG SS PI 6.5 STRL IVOR (GLOVE) ×6 IMPLANT
GOWN STRL REUS W/TWL 2XL LVL3 (GOWN DISPOSABLE) ×3 IMPLANT
GOWN STRL REUS W/TWL LRG LVL3 (GOWN DISPOSABLE) ×3 IMPLANT
HOLDER FOLEY CATH W/STRAP (MISCELLANEOUS) ×3 IMPLANT
PACK ANTERIOR HIP CUSTOM (KITS) ×3 IMPLANT
SUT MNCRL AB 4-0 PS2 18 (SUTURE) ×3 IMPLANT
SUT STRATAFIX 0 PDS 27 VIOLET (SUTURE) ×3
SUT VIC AB 1 CT1 36 (SUTURE) ×9 IMPLANT
SUT VIC AB 2-0 CT1 27 (SUTURE) ×4
SUT VIC AB 2-0 CT1 TAPERPNT 27 (SUTURE) ×2 IMPLANT
SUTURE STRATFX 0 PDS 27 VIOLET (SUTURE) ×1 IMPLANT
TRAY FOLEY MTR SLVR 16FR STAT (SET/KITS/TRAYS/PACK) IMPLANT
WATER STERILE IRR 1000ML POUR (IV SOLUTION) ×3 IMPLANT
YANKAUER SUCT BULB TIP 10FT TU (MISCELLANEOUS) IMPLANT

## 2018-01-05 NOTE — Op Note (Signed)
NAME:  Calvin Yu                ACCOUNT NO.: [id number]      MEDICAL RECORD NO.: [id number]      FACILITY:  [facility]      PHYSICIAN:  Mauri Pole  DATE OF BIRTH:  [birth year]     DATE OF PROCEDURE:  01/05/2018                                 OPERATIVE REPORT         PREOPERATIVE DIAGNOSIS: Right  hip osteoarthritis.      POSTOPERATIVE DIAGNOSIS:  Right hip osteoarthritis.      PROCEDURE:  Right total hip replacement through an anterior approach   utilizing DePuy THR system, component size 44mm pinnacle cup, a size 36+4 neutral   Altrex liner, a size 7 Hi Tri Lock stem with a 36+1.5 delta ceramic   ball.      SURGEON:  Pietro Cassis. Alvan Dame, M.D.      ASSISTANT:  Danae Orleans, PA-C     ANESTHESIA:  Spinal.      SPECIMENS:  None.      COMPLICATIONS:  None.      BLOOD LOSS:  150 cc     DRAINS:  None.      INDICATION OF THE PROCEDURE:  Calvin Yu is a 72 y.o. male who had   presented to office for evaluation of right hip pain.  Radiographs revealed   progressive degenerative changes with bone-on-bone   articulation of the  hip joint, including subchondral cystic changes and osteophytes.  The patient had painful limited range of   motion significantly affecting their overall quality of life and function.  The patient was failing to    respond to conservative measures including medications and/or injections and activity modification and at this point was ready   to proceed with more definitive measures.  Consent was obtained for   benefit of pain relief.  Specific risks of infection, DVT, component   failure, dislocation, neurovascular injury, and need for revision surgery were reviewed in the office as well discussion of   the anterior versus posterior approach were reviewed.     PROCEDURE IN DETAIL:  The patient was brought to operative theater.   Once adequate anesthesia, preoperative antibiotics, 2 gm of Ancef, 1 gm of Tranexamic Acid, and 10  mg of Decadron were administered, the patient was positioned supine on the [employer] table.  Once the patient was safely positioned with adequate padding of boney prominences we predraped out the hip, and used fluoroscopy to confirm orientation of the pelvis.      The right hip was then prepped and draped from proximal iliac crest to   mid thigh with a shower curtain technique.      Time-out was performed identifying the patient, planned procedure, and the appropriate extremity.     An incision was then made 2 cm lateral to the   anterior superior iliac spine extending over the orientation of the   tensor fascia lata muscle and sharp dissection was carried down to the   fascia of the muscle.      The fascia was then incised.  The muscle belly was identified and swept   laterally and retractor placed along the superior neck.  Following   cauterization of the circumflex vessels and removing some  pericapsular   fat, a second cobra retractor was placed on the inferior neck.  A T-capsulotomy was made along the line of the   superior neck to the trochanteric fossa, then extended proximally and   distally.  Tag sutures were placed and the retractors were then placed   intracapsular.  We then identified the trochanteric fossa and   orientation of my neck cut and then made a neck osteotomy with the femur on traction.  The femoral   head was removed without difficulty or complication.  Traction was let   off and retractors were placed posterior and anterior around the   acetabulum.      The labrum and foveal tissue were debrided.  I began reaming with a 46 mm   reamer and reamed up to 51 mm reamer with good bony bed preparation and a 52 mm  cup was chosen.  The final 52 mm Pinnacle cup was then impacted under fluoroscopy to confirm the depth of penetration and orientation with respect to   Abduction and forward flexion.  A screw was placed into the ilium followed by the hole eliminator.  The final    36+4 neutral Altrex liner was impacted with good visualized rim fit.  The cup was positioned anatomically within the acetabular portion of the pelvis.      At this point, the femur was rolled to 100 degrees.  Further capsule was   released off the inferior aspect of the femoral neck.  I then   released the superior capsule proximally.  With the leg in a neutral position the hook was placed laterally   along the femur under the vastus lateralis origin and elevated manually and then held in position using the hook attachment on the bed.  The leg was then extended and adducted with the leg rolled to 100   degrees of external rotation.  Retractors were placed along the medial calcar and posteriorly over the greater trochanter.  Once the proximal femur was fully   exposed, I used a box osteotome to set orientation.  I then began   broaching with the starting chili pepper broach and passed this by hand and then broached up to 7.  With the 7 broach in place I chose a high offset neck and did several trial reductions.  The offset was appropriate, leg lengths   appeared to be equal best matched with the +1.5 head ball trial confirmed radiographically.   Given these findings, I went ahead and dislocated the hip, repositioned all   retractors and positioned the right hip in the extended and abducted position.  The final 7 Hi Tri Lock stem was   chosen and it was impacted down to the level of neck cut.  Based on this   and the trial reductions, a final 36+1.5 delta ceramic ball was chosen and   impacted onto a clean and dry trunnion, and the hip was reduced.  The   hip had been irrigated throughout the case again at this point.  I did   reapproximate the superior capsular leaflet to the anterior leaflet   using #1 Vicryl.  The fascia of the   tensor fascia lata muscle was then reapproximated using #1 Vicryl and #0 Stratafix sutures.  The   remaining wound was closed with 2-0 Vicryl and running 4-0  Monocryl.   The hip was cleaned, dried, and dressed sterilely using Dermabond and   Aquacel dressing.  The patient was then brought  to recovery room in stable condition tolerating the procedure well.    Danae Orleans, PA-C was present for the entirety of the case involved from   preoperative positioning, perioperative retractor management, general   facilitation of the case, as well as primary wound closure as assistant.            Pietro Cassis Alvan Dame, M.D.        01/05/2018 8:38 AM

## 2018-01-05 NOTE — Progress Notes (Signed)
Care Plan Notes 10/07/2017 to 01/05/2018       Care Plan by Leonides Grills A at 12/31/2017  3:44 PM    Date of Service   Author Author Type Status Note Type File Time  12/31/2017  Lauer, [place] Plan 12/31/2017             Ortho Bundle R THA scheduled on 01-05-18 DCP:  Home with brother.  1 story home with 2 ste.   DME:  No needs.   Borrowing a RW and toilets are elevated. PT:  HEP

## 2018-01-05 NOTE — Discharge Instructions (Signed)

## 2018-01-05 NOTE — Evaluation (Signed)
Physical Therapy Evaluation Patient Details Name: Calvin Yu MRN: [id number] DOB: 04-Dec-1945 Today's Date: 01/05/2018   History of Present Illness  72 yo male s/p R THA-direct anterior 01/05/18.   Clinical Impression  On eval POD 0, pt required Min assist for mobility. He walked ~75 feet with a RW. Minimal pain with activity. Will progress activity as tolerated. Plan is for d/c home with HEP.     Follow Up Recommendations Follow surgeon's recommendation for DC plan and follow-up therapies    Equipment Recommendations  None recommended by PT    Recommendations for Other Services       Precautions / Restrictions Precautions Precautions: Fall Restrictions Weight Bearing Restrictions: No Other Position/Activity Restrictions: WBAT      Mobility  Bed Mobility Overal bed mobility: Needs Assistance Bed Mobility: Supine to Sit     Supine to sit: Min assist;HOB elevated     General bed mobility comments: Assist for R LE.   Transfers Overall transfer level: Needs assistance Equipment used: Rolling walker (2 wheeled) Transfers: Sit to/from Stand Sit to Stand: Min assist         General transfer comment: VCs safety, technique, hand placement. Assist to steady.   Ambulation/Gait Ambulation/Gait assistance: Min assist Gait Distance (Feet): 75 Feet Assistive device: Rolling walker (2 wheeled) Gait Pattern/deviations: Step-to pattern;Step-through pattern;Decreased stride length     General Gait Details: VCs safety, sequence. Assist to steady.   Stairs            Wheelchair Mobility    Modified Rankin (Stroke Patients Only)       Balance Overall balance assessment: Needs assistance         Standing balance support: Bilateral upper extremity supported Standing balance-Leahy Scale: Poor                               Pertinent Vitals/Pain Pain Assessment: 0-10 Pain Score: 5  Pain Location: R hip with activity Pain Descriptors /  Indicators: Sore Pain Intervention(s): Monitored during session    Home Living Family/patient expects to be discharged to:: Private residence Living Arrangements: Other relatives(will d/c to brother's house) Available Help at Discharge: Family Type of Home: House Home Access: Stairs to enter Entrance Stairs-Rails: None Technical brewer of Steps: 2 Home Layout: One level Home Equipment: Environmental consultant - 2 wheels      Prior Function Level of Independence: Independent with assistive device(s)         Comments: using cane prior to surgery     Hand Dominance        Extremity/Trunk Assessment   Upper Extremity Assessment Upper Extremity Assessment: Overall WFL for tasks assessed    Lower Extremity Assessment Lower Extremity Assessment: Generalized weakness(s/p R THA)    Cervical / Trunk Assessment Cervical / Trunk Assessment: Normal  Communication   Communication: No difficulties  Cognition Arousal/Alertness: Awake/alert Behavior During Therapy: WFL for tasks assessed/performed Overall Cognitive Status: Within Functional Limits for tasks assessed                                        General Comments      Exercises     Assessment/Plan    PT Assessment Patient needs continued PT services  PT Problem List Decreased strength;Decreased balance;Decreased mobility;Decreased range of motion;Decreased activity tolerance;Pain;Decreased knowledge of use of DME  PT Treatment Interventions DME instruction;Gait training;Functional mobility training;Balance training;Patient/family education;Therapeutic exercise;Therapeutic activities;Stair training    PT Goals (Current goals can be found in the Care Plan section)  Acute Rehab PT Goals Patient Stated Goal: home. regain PLOF/independence PT Goal Formulation: With patient Time For Goal Achievement: 01/19/18 Potential to Achieve Goals: Good    Frequency 7X/week   Barriers to discharge         Co-evaluation               AM-PAC PT "6 Clicks" Daily Activity  Outcome Measure Difficulty turning over in bed (including adjusting bedclothes, sheets and blankets)?: A Lot Difficulty moving from lying on back to sitting on the side of the bed? : Unable Difficulty sitting down on and standing up from a chair with arms (e.g., wheelchair, bedside commode, etc,.)?: Unable Help needed moving to and from a bed to chair (including a wheelchair)?: A Little Help needed walking in hospital room?: A Little Help needed climbing 3-5 steps with a railing? : A Little 6 Click Score: 13    End of Session Equipment Utilized During Treatment: Gait belt Activity Tolerance: Patient tolerated treatment well Patient left: in bed;with call bell/phone within reach   PT Visit Diagnosis: Pain;Other abnormalities of gait and mobility (R26.89);Difficulty in walking, not elsewhere classified (R26.2) Pain - Right/Left: Right Pain - part of body: Hip    Time: 5809-9833 PT Time Calculation (min) (ACUTE ONLY): 17 min   Charges:   PT Evaluation $PT Eval Low Complexity: 1 Low            Weston Anna, MPT Pager: [phone]

## 2018-01-05 NOTE — Anesthesia Procedure Notes (Signed)
Spinal  Patient location during procedure: OR End time: 01/05/2018 7:21 AM Staffing Resident/CRNA: Caryl Pina T, CRNA Performed: resident/CRNA  Preanesthetic Checklist Completed: patient identified, site marked, surgical consent, pre-op evaluation, timeout performed, IV checked, risks and benefits discussed and monitors and equipment checked Spinal Block Patient position: sitting Prep: ChloraPrep Patient monitoring: heart rate, cardiac monitor, continuous pulse ox and blood pressure Approach: midline Location: L3-4 Injection technique: single-shot Needle Needle type: Pencan  Needle gauge: 24 G Needle length: 9 cm Assessment Sensory level: T6 Additional Notes Expiration date of kit checked and confirmed. Patient tolerated procedure well, without complications.

## 2018-01-05 NOTE — Interval H&P Note (Signed)
History and Physical Interval Note:  01/05/2018 6:12 AM  Unknown Foley  has presented today for surgery, with the diagnosis of Right hip osteoarthritis  The various methods of treatment have been discussed with the patient and family. After consideration of risks, benefits and other options for treatment, the patient has consented to  Procedure(s) with comments: RIGHT TOTAL HIP ARTHROPLASTY ANTERIOR APPROACH (Right) - 70 mins as a surgical intervention .  The patient's history has been reviewed, patient examined, no change in status, stable for surgery.  I have reviewed the patient's chart and labs.  Questions were answered to the patient's satisfaction.     Mauri Pole

## 2018-01-05 NOTE — Anesthesia Postprocedure Evaluation (Signed)
Anesthesia Post Note  Patient: Calvin Yu  Procedure(s) Performed: RIGHT TOTAL HIP ARTHROPLASTY ANTERIOR APPROACH (Right Hip)     Patient location during evaluation: PACU Anesthesia Type: Spinal Level of consciousness: oriented and awake and alert Pain management: pain level controlled Vital Signs Assessment: post-procedure vital signs reviewed and stable Respiratory status: spontaneous breathing, respiratory function stable and patient connected to nasal cannula oxygen Cardiovascular status: blood pressure returned to baseline and stable Postop Assessment: no headache, no backache, no apparent nausea or vomiting, spinal receding and patient able to bend at knees Anesthetic complications: no    Last Vitals:  Vitals:   01/05/18 1128 01/05/18 1129  BP:  (!) 147/87  Pulse: 60 67  Resp: 14 16  Temp: (!) 36.3 C (!) 36.3 C  SpO2:  100%    Last Pain:  Vitals:   01/05/18 1129  TempSrc: Oral  PainSc:                  Jeanice Dempsey,W. EDMOND

## 2018-01-05 NOTE — Transfer of Care (Signed)
Immediate Anesthesia Transfer of Care Note  Patient: Calvin Yu  Procedure(s) Performed: RIGHT TOTAL HIP ARTHROPLASTY ANTERIOR APPROACH (Right Hip)  Patient Location: PACU  Anesthesia Type:Spinal  Level of Consciousness: oriented, drowsy and patient cooperative  Airway & Oxygen Therapy: Patient Spontanous Breathing and Patient connected to face mask oxygen  Post-op Assessment: Report given to RN, Post -op Vital signs reviewed and stable and Patient moving all extremities  Post vital signs: Reviewed and stable  Last Vitals:  Vitals Value Taken Time  BP 123/66 01/05/2018  9:00 AM  Temp    Pulse 64 01/05/2018  9:06 AM  Resp 19 01/05/2018  9:06 AM  SpO2 100 % 01/05/2018  9:06 AM  Vitals shown include unvalidated device data.  Last Pain:  Vitals:   01/05/18 0544  TempSrc:   PainSc: 0-No pain      Patients Stated Pain Goal: 4 (65/03/54 6568)  Complications: No apparent anesthesia complications

## 2018-01-06 ENCOUNTER — Encounter (HOSPITAL_COMMUNITY): Payer: Self-pay | Admitting: Orthopedic Surgery

## 2018-01-06 DIAGNOSIS — E663 Overweight: Secondary | ICD-10-CM | POA: Diagnosis present

## 2018-01-06 DIAGNOSIS — E876 Hypokalemia: Secondary | ICD-10-CM

## 2018-01-06 LAB — CBC
HEMATOCRIT: 43.5 % (ref 39.0–52.0)
Hemoglobin: 15.3 g/dL (ref 13.0–17.0)
MCH: 35.8 pg — ABNORMAL HIGH (ref 26.0–34.0)
MCHC: 35.2 g/dL (ref 30.0–36.0)
MCV: 101.9 fL — ABNORMAL HIGH (ref 78.0–100.0)
Platelets: 115 10*3/uL — ABNORMAL LOW (ref 150–400)
RBC: 4.27 MIL/uL (ref 4.22–5.81)
RDW: 14.1 % (ref 11.5–15.5)
WBC: 11.8 10*3/uL — AB (ref 4.0–10.5)

## 2018-01-06 LAB — BASIC METABOLIC PANEL
Anion gap: 9 (ref 5–15)
BUN: 13 mg/dL (ref 8–23)
CALCIUM: 8.3 mg/dL — AB (ref 8.9–10.3)
CO2: 29 mmol/L (ref 22–32)
Chloride: 103 mmol/L (ref 98–111)
Creatinine, Ser: 0.61 mg/dL (ref 0.61–1.24)
GFR calc Af Amer: >60 mL/min (ref >60)
Glucose, Bld: 126 mg/dL — ABNORMAL HIGH (ref 70–99)
Potassium: 3 mmol/L — ABNORMAL LOW (ref 3.5–5.1)
Sodium: 141 mmol/L (ref 135–145)

## 2018-01-06 MED ORDER — POTASSIUM CHLORIDE CRYS ER 20 MEQ PO TBCR
40.0000 meq | EXTENDED_RELEASE_TABLET | Freq: Once | ORAL | Status: AC
  Administered 2018-01-06: 40 meq via ORAL
  Filled 2018-01-06: qty 2

## 2018-01-06 NOTE — Progress Notes (Signed)
Physical Therapy Treatment Patient Details Name: Calvin Yu MRN: [id number] DOB: 1946-06-08 Today's Date: 01/06/2018    History of Present Illness 72 yo male s/p R THA-direct anterior 01/05/18.     PT Comments    Progressing with mobility. Reviewed/practiced exercises, gait training, and stair training. Issued HEP for pt to perform 2x/day. All education completed. Okay to d/c from PT standpoint-made RN aware.    Follow Up Recommendations  Follow surgeon's recommendation for DC plan and follow-up therapies     Equipment Recommendations  None recommended by PT    Recommendations for Other Services       Precautions / Restrictions Precautions Precautions: Fall Restrictions Weight Bearing Restrictions: No Other Position/Activity Restrictions: WBAT    Mobility  Bed Mobility               General bed mobility comments: oob in recliner  Transfers Overall transfer level: Needs assistance Equipment used: Rolling walker (2 wheeled) Transfers: Sit to/from Stand Sit to Stand: Min guard         General transfer comment: close guard for safety. VCS safety hand placement  Ambulation/Gait Ambulation/Gait assistance: Min guard Gait Distance (Feet): 150 Feet Assistive device: Rolling walker (2 wheeled) Gait Pattern/deviations: Step-through pattern;Decreased stride length     General Gait Details: close guard for safety.    Stairs Stairs: Yes Min Assist Stair Management: Forwards;Step to pattern;With cane Number of Stairs: 2 General stair comments: up and over portable steps with 1 cane, 1 HHA. Assist to steady. VCs safety, technique, sequence.    Wheelchair Mobility    Modified Rankin (Stroke Patients Only)       Balance Overall balance assessment: Mild deficits observed, not formally tested                                          Cognition Arousal/Alertness: Awake/alert Behavior During Therapy: WFL for tasks  assessed/performed Overall Cognitive Status: Within Functional Limits for tasks assessed                                        Exercises Total Joint Exercises Hip ABduction/ADduction: AROM;Right;10 reps;Standing Knee Flexion: AROM;Right;10 reps;Standing Marching in Standing: AROM;Both;10 reps;Standing General Exercises - Lower Extremity Heel Raises: AROM;Both;10 reps;Standing    General Comments        Pertinent Vitals/Pain Pain Assessment: 0-10 Pain Score: 3  Pain Location: R hip with activity Pain Descriptors / Indicators: Sore;Tightness Pain Intervention(s): Monitored during session;Repositioned    Home Living                      Prior Function            PT Goals (current goals can now be found in the care plan section) Progress towards PT goals: Progressing toward goals    Frequency    7X/week      PT Plan Current plan remains appropriate    Co-evaluation              AM-PAC PT "6 Clicks" Daily Activity  Outcome Measure  Difficulty turning over in bed (including adjusting bedclothes, sheets and blankets)?: A Little Difficulty moving from lying on back to sitting on the side of the bed? : A Little Difficulty sitting down on and standing up from a  chair with arms (e.g., wheelchair, bedside commode, etc,.)?: A Little Help needed moving to and from a bed to chair (including a wheelchair)?: A Little Help needed walking in hospital room?: A Little Help needed climbing 3-5 steps with a railing? : A Little 6 Click Score: 18    End of Session Equipment Utilized During Treatment: Gait belt Activity Tolerance: Patient tolerated treatment well Patient left: in chair;with call bell/phone within reach   PT Visit Diagnosis: Pain;Other abnormalities of gait and mobility (R26.89) Pain - Right/Left: Right Pain - part of body: Hip     Time: 4103-0131 PT Time Calculation (min) (ACUTE ONLY): 8 min  Charges:  $Gait Training: 8-22  mins                       Weston Anna, MPT Pager: [phone]

## 2018-01-06 NOTE — Progress Notes (Addendum)
     Subjective: 1 Day Post-Op Procedure(s) (LRB): RIGHT TOTAL HIP ARTHROPLASTY ANTERIOR APPROACH (Right)    Seen by Dr. Alvan Dame. Patient reports pain as mild, pain controlled well.  No reported events throughout the night.  Patient looking forward to doing well and progressing in his recovery.  Discussed his low potassium and receiving a dose today.  Patient is ready for discharge home, if he does well with therapy.   Objective:   VITALS:   Vitals:   01/06/18 0232 01/06/18 0611  BP: 126/73 123/77  Pulse: (!) 57 (!) 53  Resp: 16 18  Temp:  (!) 97.5 F (36.4 C)  SpO2: 96% 98%    Dorsiflexion/Plantar flexion intact Incision: dressing C/D/I No cellulitis present Compartment soft  LABS Recent Labs    01/06/18 0532  HGB 15.3  HCT 43.5  WBC 11.8*  PLT 115*    Recent Labs    01/06/18 0532  NA 141  K 3.0*  BUN 13  CREATININE 0.61  GLUCOSE 126*     Assessment/Plan: 1 Day Post-Op Procedure(s) (LRB): RIGHT TOTAL HIP ARTHROPLASTY ANTERIOR APPROACH (Right) Foley cath d/c'ed Advance diet Up with therapy D/C IV fluids Discharge home Follow up in 2 weeks at [facility] Follow up with OLIN,Kimmy Parish D in 2 weeks.  Contact information:  [facility] [address], [place] [phone]    Overweight (BMI 25-29.9) Estimated body mass index is 27.91 kg/m as calculated from the following:   Height as of this encounter: 5\' 9"  (1.753 m).   Weight as of this encounter: 85.7 kg (189 lb). Patient also counseled that weight may inhibit the healing process Patient counseled that losing weight will help with future health issues  Hypokalemia Treated with oral potassium        West Pugh. Lakie Mclouth   PAC  01/06/2018, 7:50 AM

## 2018-01-06 NOTE — Progress Notes (Signed)
Physical Therapy Treatment Patient Details Name: Calvin Yu MRN: [id number] DOB: January 09, 1946 Today's Date: 01/06/2018    History of Present Illness 72 yo male s/p R THA-direct anterior 01/05/18.     PT Comments    Progressing with mobility. Will plan to have a 2nd session to practice stair negotiation prior to d/c.    Follow Up Recommendations  Follow surgeon's recommendation for DC plan and follow-up therapies     Equipment Recommendations  None recommended by PT    Recommendations for Other Services       Precautions / Restrictions Precautions Precautions: Fall Restrictions Weight Bearing Restrictions: No Other Position/Activity Restrictions: WBAT    Mobility  Bed Mobility Overal bed mobility: Needs Assistance Bed Mobility: Supine to Sit     Supine to sit: Supervision     General bed mobility comments: for safety.   Transfers Overall transfer level: Needs assistance Equipment used: Rolling walker (2 wheeled) Transfers: Sit to/from Stand Sit to Stand: Min guard         General transfer comment: close guard for safety. VCS safety hand placement  Ambulation/Gait Ambulation/Gait assistance: Min guard Gait Distance (Feet): 150 Feet Assistive device: Rolling walker (2 wheeled) Gait Pattern/deviations: Step-through pattern;Decreased stride length     General Gait Details: close guard for safety.    Stairs             Wheelchair Mobility    Modified Rankin (Stroke Patients Only)       Balance Overall balance assessment: Mild deficits observed, not formally tested                                          Cognition Arousal/Alertness: Awake/alert Behavior During Therapy: WFL for tasks assessed/performed Overall Cognitive Status: Within Functional Limits for tasks assessed                                        Exercises Total Joint Exercises Ankle Circles/Pumps: AROM;Both;10 reps;Supine Quad Sets:  AROM;Both;10 reps;Supine Heel Slides: AROM;Right;10 reps;Supine Hip ABduction/ADduction: AROM;Right;10 reps;Supine Long Arc Quad: AROM;Right;10 reps;Seated    General Comments        Pertinent Vitals/Pain Pain Assessment: 0-10 Pain Score: 2  Pain Location: R hip with activity Pain Descriptors / Indicators: Sore;Tightness Pain Intervention(s): Monitored during session;Repositioned;Ice applied    Home Living                      Prior Function            PT Goals (current goals can now be found in the care plan section) Progress towards PT goals: Progressing toward goals    Frequency    7X/week      PT Plan Current plan remains appropriate    Co-evaluation              AM-PAC PT "6 Clicks" Daily Activity  Outcome Measure  Difficulty turning over in bed (including adjusting bedclothes, sheets and blankets)?: A Little Difficulty moving from lying on back to sitting on the side of the bed? : A Little Difficulty sitting down on and standing up from a chair with arms (e.g., wheelchair, bedside commode, etc,.)?: A Little Help needed moving to and from a bed to chair (including a wheelchair)?: A Little Help needed walking  in hospital room?: A Little Help needed climbing 3-5 steps with a railing? : A Little 6 Click Score: 18    End of Session Equipment Utilized During Treatment: Gait belt Activity Tolerance: Patient tolerated treatment well Patient left: in chair;with call bell/phone within reach   PT Visit Diagnosis: Pain;Other abnormalities of gait and mobility (R26.89) Pain - Right/Left: Right Pain - part of body: Hip     Time: 6940-9828 PT Time Calculation (min) (ACUTE ONLY): 20 min  Charges:  $Gait Training: 8-22 mins                        Weston Anna, MPT Pager: [phone]

## 2018-01-12 NOTE — Discharge Summary (Signed)
Physician Discharge Summary  Patient ID: Calvin Yu MRN: [id number] DOB/AGE: 11-01-1945 72 y.o.  Admit date: 01/05/2018 Discharge date: 01/06/2018   Procedures:  Procedure(s) (LRB): RIGHT TOTAL HIP ARTHROPLASTY ANTERIOR APPROACH (Right)  Attending Physician:  Dr. Paralee Cancel   Admission Diagnoses:   Right hip primary OA / pain  Discharge Diagnoses:  Principal Problem:   S/P right THA, AA Active Problems:   Overweight (BMI 25.0-29.9)   Hypokalemia  Past Medical History:  Diagnosis Date  . Arthritis   . Cancer ([place]) 2017   Rectal  . GERD (gastroesophageal reflux disease)   . Headache   . Hypertension   . Hypothyroidism     HPI:    Calvin Yu, 72 y.o. male, has a history of pain and functional disability in the right hip due to arthritis and patient has failed non-surgical conservative treatments for greater than 12 weeks to include NSAID's and/or analgesics, use of assistive devices and activity modification.  Onset of symptoms was gradual starting 3+ years ago with gradually worsening course since that time.The patient noted no past surgery on the right hip(s).  Patient currently rates pain in the right hip at 9 out of 10 with activity. Patient has night pain, worsening of pain with activity and weight bearing, trendelenberg gait, pain that interfers with activities of daily living and pain with passive range of motion. Patient has evidence of periarticular osteophytes and joint space narrowing by imaging studies. This condition presents safety issues increasing the risk of falls.  There is no current active infection.  Risks, benefits and expectations were discussed with the patient.  Risks including but not limited to the risk of anesthesia, blood clots, nerve damage, blood vessel damage, failure of the prosthesis, infection and up to and including death.  Patient understand the risks, benefits and expectations and wishes to proceed with surgery.   PCP: Manfred Shirts,  PA   Discharged Condition: good  Hospital Course:  Patient underwent the above stated procedure on 01/05/2018. Patient tolerated the procedure well and brought to the recovery room in good condition and subsequently to the floor.  POD #1 BP: 123/77 ; Pulse: 53 ; Temp: 97.5 F (36.4 C) ; Resp: 18 Patient reports pain as mild, pain controlled well.  No reported events throughout the night.  Patient looking forward to doing well and progressing in his recovery.  Discussed his low potassium and receiving a dose today.  Patient is ready for discharge home, if he does well with therapy. Dorsiflexion/plantar flexion intact, incision: dressing C/D/I, no cellulitis present and compartment soft.   LABS  Basename    HGB     15.3  HCT     43.5    Discharge Exam: General appearance: alert, cooperative and no distress Extremities: Homans sign is negative, no sign of DVT, no edema, redness or tenderness in the calves or thighs and no ulcers, gangrene or trophic changes  Disposition:  Home with follow up in 2 weeks   Follow-up Information    Paralee Cancel, MD. Schedule an appointment as soon as possible for a visit in 2 weeks.   Specialty:  Orthopedic Surgery Contact information: [address] [place] [address] [phone]           Discharge Instructions    Call MD / Call 911   Complete by:  As directed    If you experience chest pain or shortness of breath, CALL 911 and be transported to the hospital  emergency room.  If you develope a fever above 101 F, pus (white drainage) or increased drainage or redness at the wound, or calf pain, call your surgeon's office.   Change dressing   Complete by:  As directed    Maintain surgical dressing until follow up in the clinic. If the edges start to pull up, may reinforce with tape. If the dressing is no longer working, may remove and cover with gauze and tape, but must keep the area dry and clean.  Call with any questions or  concerns.   Constipation Prevention   Complete by:  As directed    Drink plenty of fluids.  Prune juice may be helpful.  You may use a stool softener, such as Colace (over the counter) 100 mg twice a day.  Use MiraLax (over the counter) for constipation as needed.   Diet - low sodium heart healthy   Complete by:  As directed    Discharge instructions   Complete by:  As directed    Maintain surgical dressing until follow up in the clinic. If the edges start to pull up, may reinforce with tape. If the dressing is no longer working, may remove and cover with gauze and tape, but must keep the area dry and clean.  Follow up in 2 weeks at [facility]. Call with any questions or concerns.   Increase activity slowly as tolerated   Complete by:  As directed    Weight bearing as tolerated with assist device (walker, cane, etc) as directed, use it as long as suggested by your surgeon or therapist, typically at least 4-6 weeks.   TED hose   Complete by:  As directed    Use stockings (TED hose) for 2 weeks on both leg(s).  You may remove them at night for sleeping.      Allergies as of 01/06/2018      Reactions   Contrast Media [iodinated Diagnostic Agents] Nausea Only   Tomato Other (See Comments)   "extreme headaches"      Medication List    TAKE these medications   allopurinol 300 MG tablet Commonly known as:  ZYLOPRIM Take 300 mg by mouth daily.   aspirin 81 MG chewable tablet Commonly known as:  ASPIRIN CHILDRENS Chew 1 tablet (81 mg total) by mouth 2 (two) times daily. Take for 4 weeks, then resume regular dose.   bimatoprost 0.01 % Soln Commonly known as:  LUMIGAN Place 1 drop into both eyes at bedtime.   COMBIGAN 0.2-0.5 % ophthalmic solution Generic drug:  brimonidine-timolol Place 1 drop into both eyes every 12 (twelve) hours.   docusate sodium 100 MG capsule Commonly known as:  COLACE Take 1 capsule (100 mg total) by mouth 2 (two) times daily.   ferrous  sulfate 325 (65 FE) MG tablet Commonly known as:  FERROUSUL Take 1 tablet (325 mg total) by mouth 3 (three) times daily with meals.   HYDROcodone-acetaminophen 7.5-325 MG tablet Commonly known as:  NORCO Take 1-2 tablets by mouth every 4 (four) hours as needed for moderate pain.   levothyroxine 175 MCG tablet Commonly known as:  SYNTHROID, LEVOTHROID Take 175 mcg by mouth daily before breakfast.   MAGNESIUM PO Take 400 mg by mouth daily.   methocarbamol 500 MG tablet Commonly known as:  ROBAXIN Take 1 tablet (500 mg total) by mouth every 6 (six) hours as needed for muscle spasms.   pantoprazole 40 MG tablet Commonly known as:  PROTONIX Take 40 mg by  mouth daily.   polyethylene glycol packet Commonly known as:  MIRALAX / GLYCOLAX Take 17 g by mouth 2 (two) times daily.   potassium chloride SA 20 MEQ tablet Commonly known as:  K-DUR,KLOR-CON Take 20 mEq by mouth daily.   tamsulosin 0.4 MG Caps capsule Commonly known as:  FLOMAX Take 0.4 mg by mouth daily.            Discharge Care Instructions  (From admission, onward)        Start     Ordered   01/06/18 0000  Change dressing    Comments:  Maintain surgical dressing until follow up in the clinic. If the edges start to pull up, may reinforce with tape. If the dressing is no longer working, may remove and cover with gauze and tape, but must keep the area dry and clean.  Call with any questions or concerns.   01/06/18 0750       Signed: West Pugh. Antoine Fiallos   PA-C  01/12/2018, 9:25 AM

## 2018-11-13 IMAGING — DX DG PORTABLE PELVIS
1 series · 1 of 1 positions shown · non-contrast
Comparison: Intraoperative fluoro spot views of today's date

CLINICAL DATA: Status post anterior approach right hip joint
replacement.

EXAM:
PORTABLE PELVIS 1-2 VIEWS

[pelvis ap]
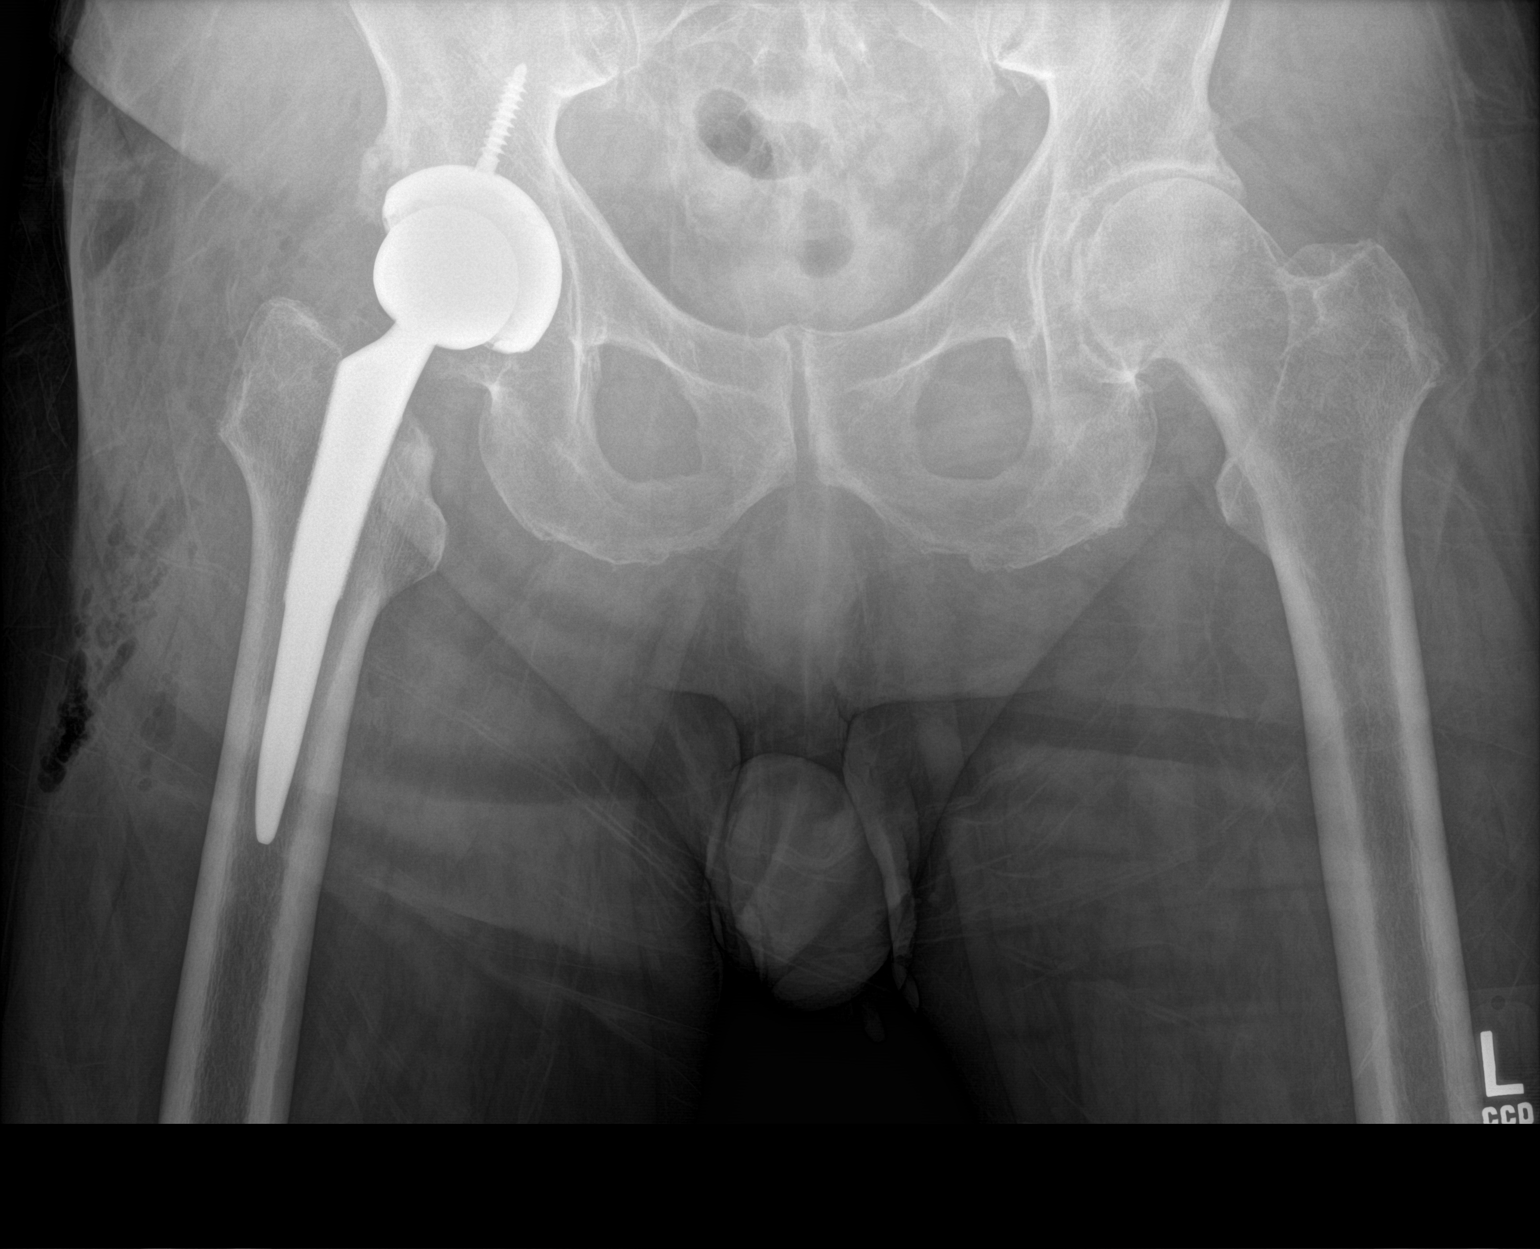

[1 of 1 positions shown; findings below may reference images not displayed]

FINDINGS: The patient has undergone left hip joint prosthesis placement.
Radiographic positioning of the prosthetic components is good. The
interface with the native bone appears normal.
IMPRESSION: No immediate complication following anterior approach right hip
joint prosthesis placement.
